# Patient Record
Sex: Female | Born: 1990 | Race: White | Hispanic: No | Marital: Married
Health system: Southern US, Community
[De-identification: ages and names within clinical notes are randomized; demographics above are authoritative.]

## PROBLEM LIST (undated history)

## (undated) DIAGNOSIS — F329 Major depressive disorder, single episode, unspecified: Secondary | ICD-10-CM

## (undated) DIAGNOSIS — F419 Anxiety disorder, unspecified: Secondary | ICD-10-CM

## (undated) DIAGNOSIS — F32A Depression, unspecified: Secondary | ICD-10-CM

## (undated) HISTORY — DX: Depression, unspecified: F32.A

## (undated) HISTORY — DX: Anxiety disorder, unspecified: F41.9

---
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
## 2009-11-26 ENCOUNTER — Ambulatory Visit (HOSPITAL_COMMUNITY): Admission: RE | Admit: 2009-11-26 | Discharge: 2009-11-26 | Payer: Self-pay | Admitting: Obstetrics and Gynecology

## 2010-01-23 ENCOUNTER — Ambulatory Visit (HOSPITAL_COMMUNITY): Admission: RE | Admit: 2010-01-23 | Discharge: 2010-01-23 | Payer: Self-pay | Admitting: Obstetrics and Gynecology

## 2010-03-07 ENCOUNTER — Ambulatory Visit (HOSPITAL_COMMUNITY): Admission: RE | Admit: 2010-03-07 | Discharge: 2010-03-07 | Payer: Self-pay | Admitting: Obstetrics and Gynecology

## 2011-01-18 ENCOUNTER — Encounter: Payer: Self-pay | Admitting: Obstetrics and Gynecology

## 2011-08-21 IMAGING — US US OB FOLLOW-UP
1 series · 14 of 28 positions shown · non-contrast
Comparison: none

OBSTETRICAL ULTRASOUND:
 This ultrasound was performed in The [HOSPITAL], and the [facility] report will be stored to [REDACTED] PACS.  This report is also available in [HOSPITAL]?s accessANYware.

[Series 1: us ob follow-up · 14 of 37 slices shown]
[im 2/37]
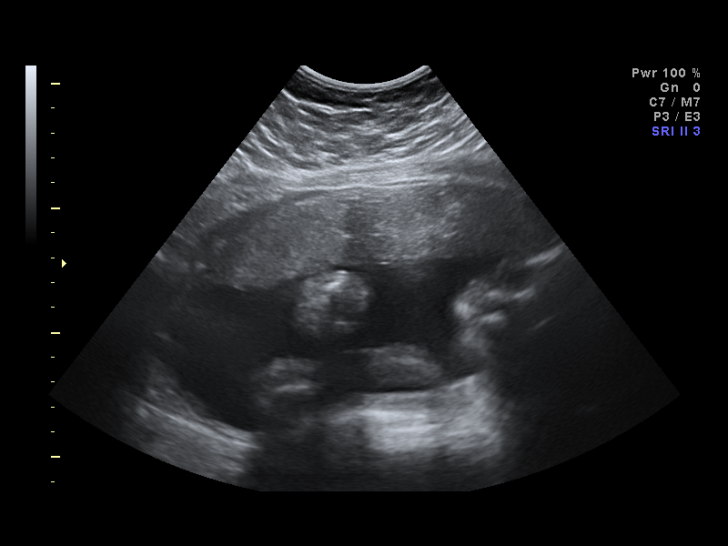
[im 5/37]
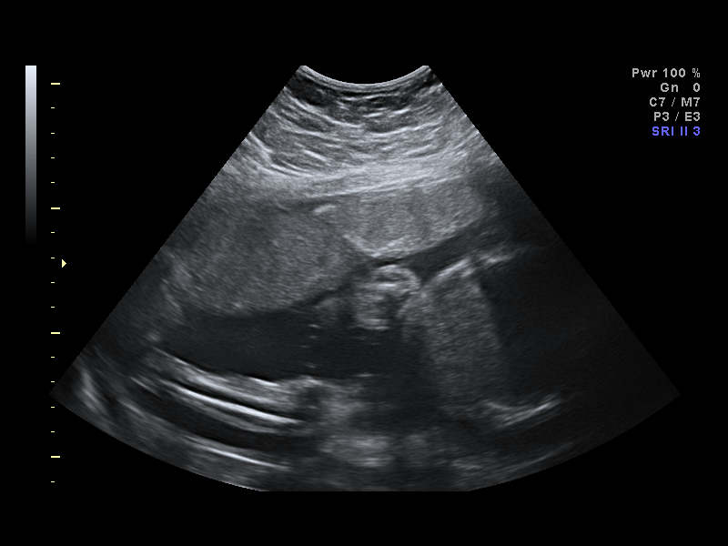
[im 7/37]
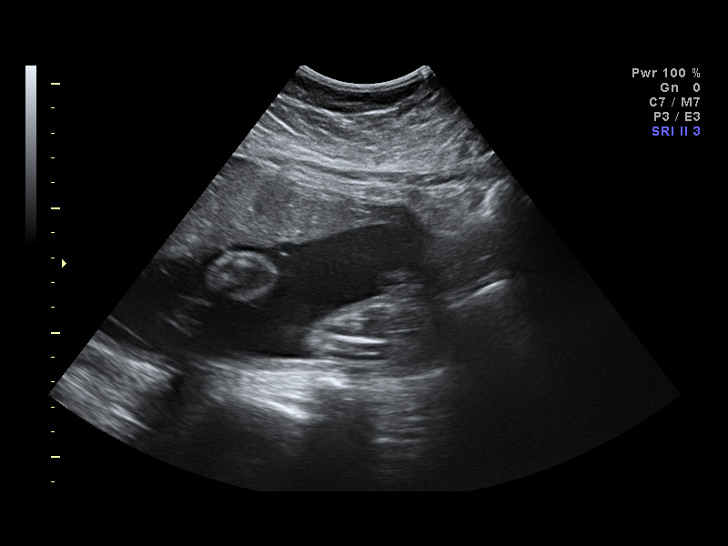
[im 10/37]
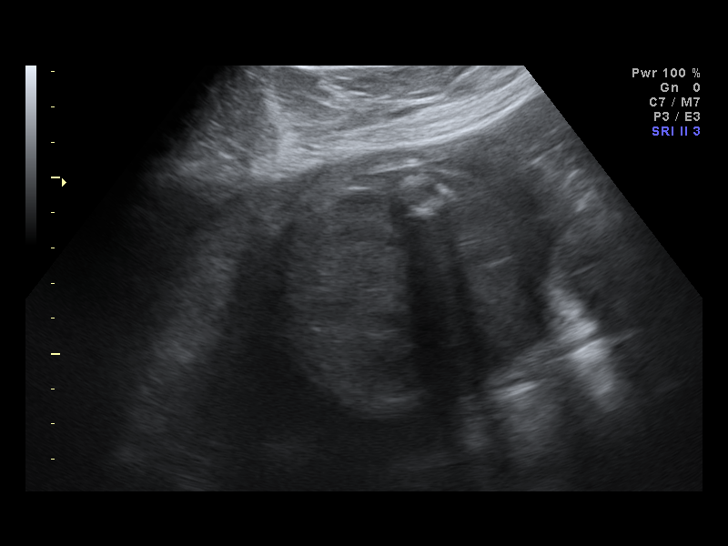
[im 13/37]
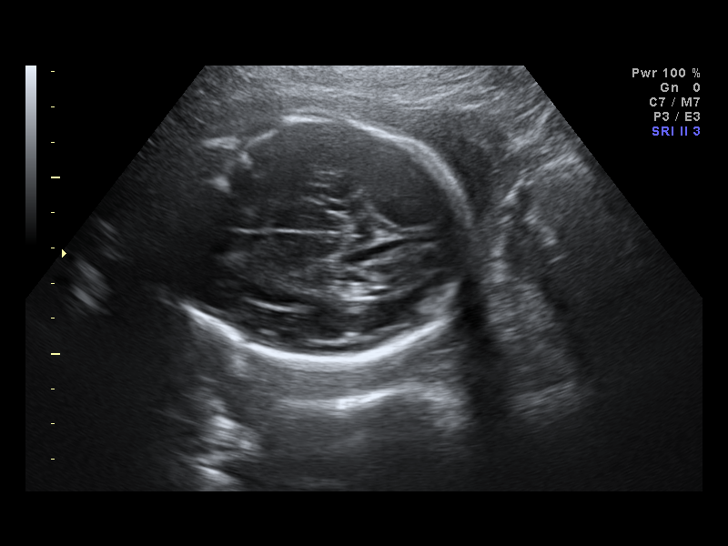
[im 15/37]
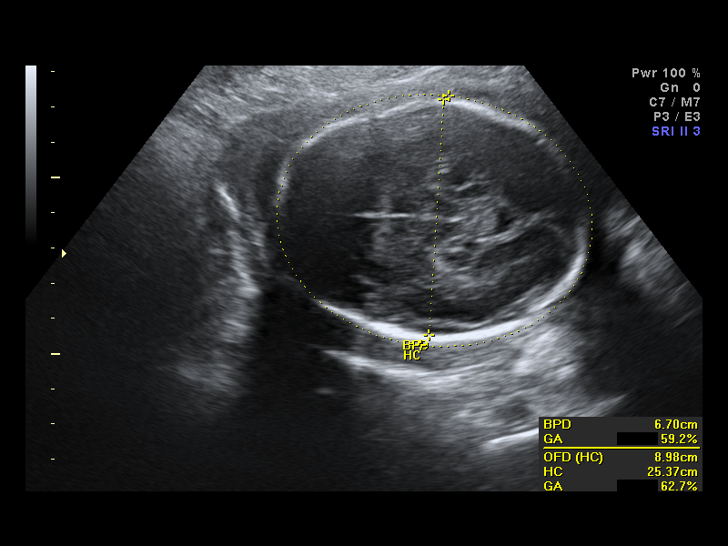
[im 18/37]
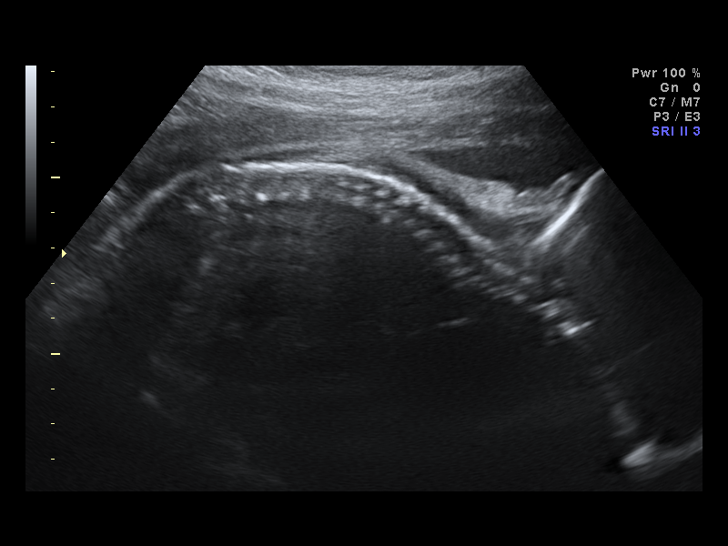
[im 21/37]
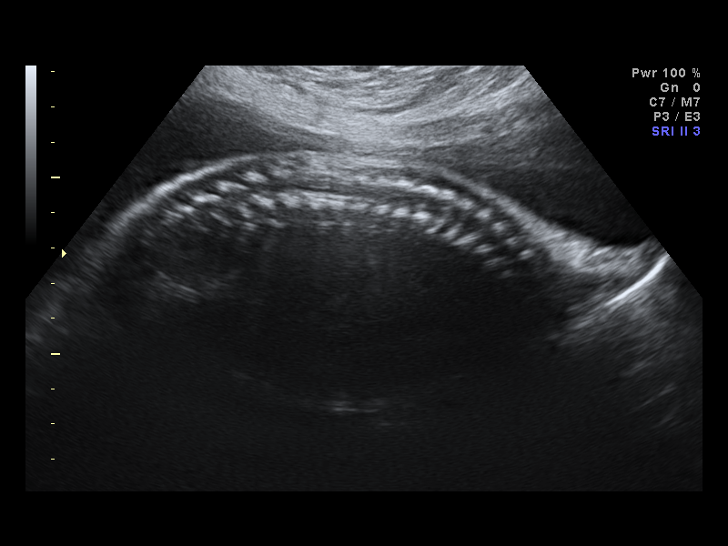
[im 23/37]
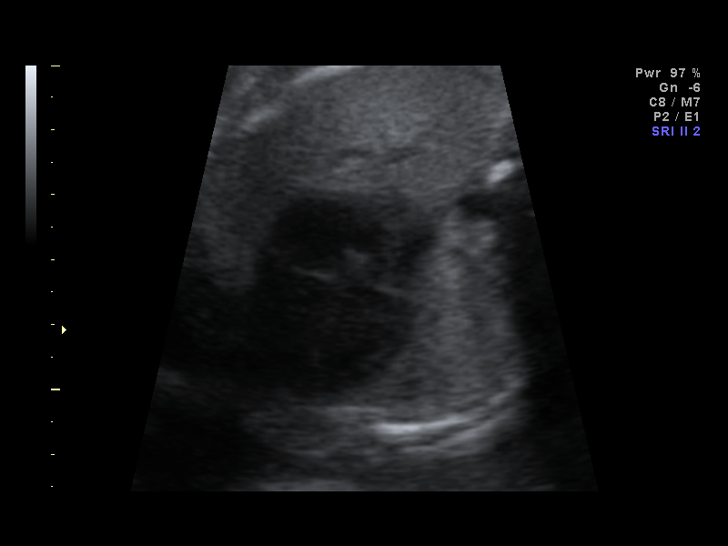
[im 26/37]
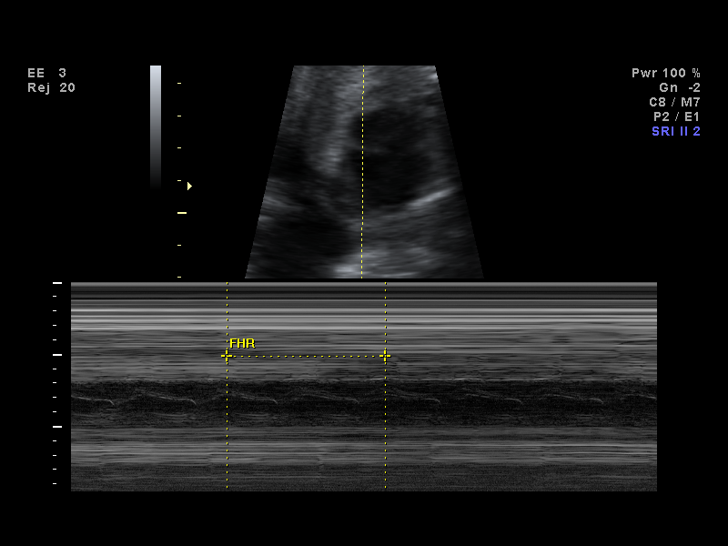
[im 29/37]
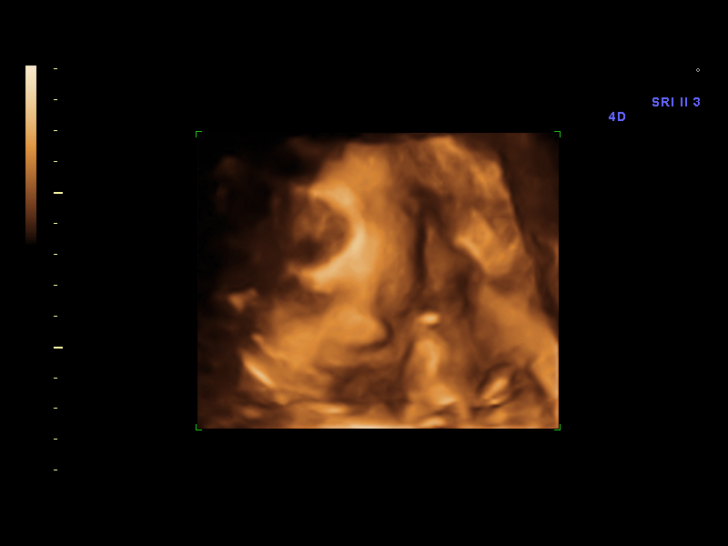
[im 31/37]
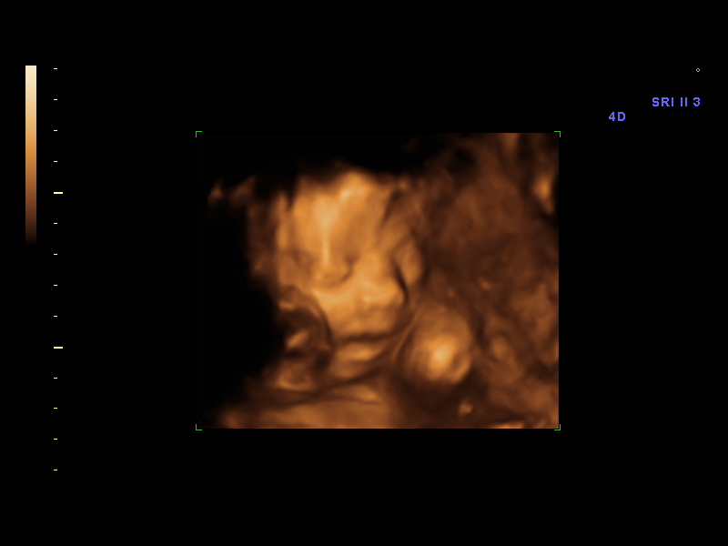
[im 34/37]
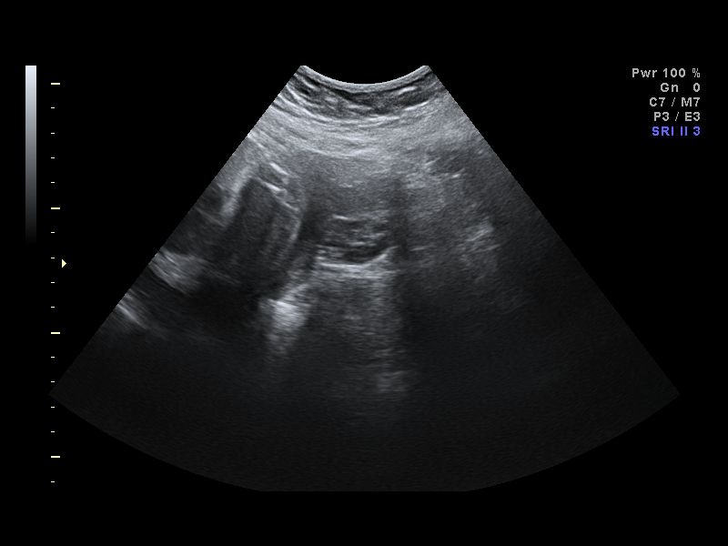
[im 37/37]
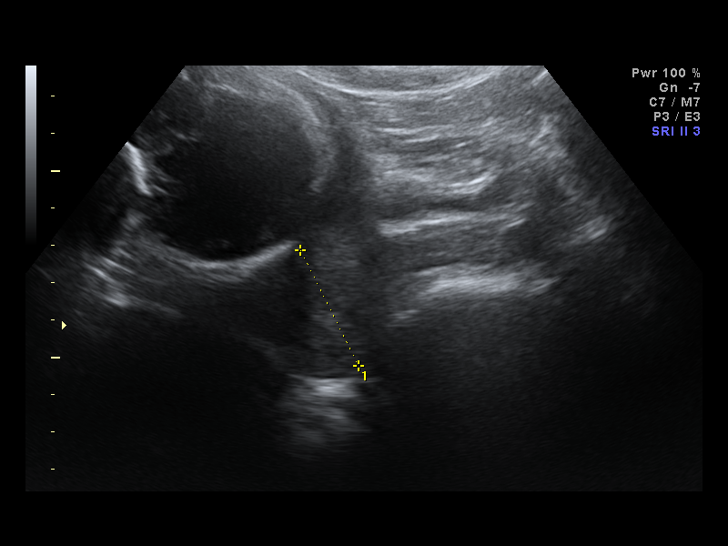

[14 of 28 positions shown; findings below may reference images not displayed]

IMPRESSION: [facility] has also been faxed to the ordering physician.

## 2017-01-22 DIAGNOSIS — F329 Major depressive disorder, single episode, unspecified: Secondary | ICD-10-CM | POA: Insufficient documentation

## 2017-01-22 DIAGNOSIS — F331 Major depressive disorder, recurrent, moderate: Secondary | ICD-10-CM | POA: Diagnosis not present

## 2017-01-22 DIAGNOSIS — R5383 Other fatigue: Secondary | ICD-10-CM | POA: Diagnosis not present

## 2017-01-22 DIAGNOSIS — R3981 Functional urinary incontinence: Secondary | ICD-10-CM | POA: Diagnosis not present

## 2017-01-22 DIAGNOSIS — R1030 Lower abdominal pain, unspecified: Secondary | ICD-10-CM | POA: Diagnosis not present

## 2017-08-30 DIAGNOSIS — N83201 Unspecified ovarian cyst, right side: Secondary | ICD-10-CM | POA: Diagnosis not present

## 2017-08-30 DIAGNOSIS — R109 Unspecified abdominal pain: Secondary | ICD-10-CM | POA: Diagnosis not present

## 2017-08-30 DIAGNOSIS — R1031 Right lower quadrant pain: Secondary | ICD-10-CM | POA: Diagnosis not present

## 2017-08-30 DIAGNOSIS — F17210 Nicotine dependence, cigarettes, uncomplicated: Secondary | ICD-10-CM | POA: Diagnosis not present

## 2017-08-30 DIAGNOSIS — L259 Unspecified contact dermatitis, unspecified cause: Secondary | ICD-10-CM | POA: Diagnosis not present

## 2017-08-30 DIAGNOSIS — R51 Headache: Secondary | ICD-10-CM | POA: Diagnosis not present

## 2017-08-30 DIAGNOSIS — R102 Pelvic and perineal pain: Secondary | ICD-10-CM | POA: Diagnosis not present

## 2017-09-21 DIAGNOSIS — Z3046 Encounter for surveillance of implantable subdermal contraceptive: Secondary | ICD-10-CM | POA: Diagnosis not present

## 2017-12-13 DIAGNOSIS — Z3200 Encounter for pregnancy test, result unknown: Secondary | ICD-10-CM | POA: Diagnosis not present

## 2017-12-15 DIAGNOSIS — L300 Nummular dermatitis: Secondary | ICD-10-CM | POA: Diagnosis not present

## 2017-12-15 DIAGNOSIS — L299 Pruritus, unspecified: Secondary | ICD-10-CM | POA: Diagnosis not present

## 2018-03-11 DIAGNOSIS — R531 Weakness: Secondary | ICD-10-CM | POA: Diagnosis not present

## 2018-03-11 DIAGNOSIS — R5383 Other fatigue: Secondary | ICD-10-CM | POA: Diagnosis not present

## 2018-05-12 DIAGNOSIS — Z6841 Body Mass Index (BMI) 40.0 and over, adult: Secondary | ICD-10-CM | POA: Diagnosis not present

## 2018-05-12 DIAGNOSIS — F411 Generalized anxiety disorder: Secondary | ICD-10-CM | POA: Diagnosis not present

## 2018-05-12 DIAGNOSIS — Z1331 Encounter for screening for depression: Secondary | ICD-10-CM | POA: Diagnosis not present

## 2018-06-10 DIAGNOSIS — B09 Unspecified viral infection characterized by skin and mucous membrane lesions: Secondary | ICD-10-CM | POA: Diagnosis not present

## 2018-06-10 DIAGNOSIS — Z6841 Body Mass Index (BMI) 40.0 and over, adult: Secondary | ICD-10-CM | POA: Diagnosis not present

## 2018-06-12 DIAGNOSIS — R21 Rash and other nonspecific skin eruption: Secondary | ICD-10-CM | POA: Diagnosis not present

## 2018-06-12 DIAGNOSIS — L309 Dermatitis, unspecified: Secondary | ICD-10-CM | POA: Diagnosis not present

## 2018-06-12 DIAGNOSIS — L539 Erythematous condition, unspecified: Secondary | ICD-10-CM | POA: Diagnosis not present

## 2018-06-12 DIAGNOSIS — L299 Pruritus, unspecified: Secondary | ICD-10-CM | POA: Diagnosis not present

## 2018-06-12 DIAGNOSIS — Z6841 Body Mass Index (BMI) 40.0 and over, adult: Secondary | ICD-10-CM | POA: Diagnosis not present

## 2018-07-04 DIAGNOSIS — Z3481 Encounter for supervision of other normal pregnancy, first trimester: Secondary | ICD-10-CM | POA: Diagnosis not present

## 2018-07-04 DIAGNOSIS — Z139 Encounter for screening, unspecified: Secondary | ICD-10-CM | POA: Diagnosis not present

## 2018-07-13 DIAGNOSIS — O039 Complete or unspecified spontaneous abortion without complication: Secondary | ICD-10-CM | POA: Diagnosis not present

## 2018-07-13 DIAGNOSIS — O209 Hemorrhage in early pregnancy, unspecified: Secondary | ICD-10-CM | POA: Diagnosis not present

## 2018-07-13 DIAGNOSIS — Z3A00 Weeks of gestation of pregnancy not specified: Secondary | ICD-10-CM | POA: Diagnosis not present

## 2018-10-11 DIAGNOSIS — J069 Acute upper respiratory infection, unspecified: Secondary | ICD-10-CM | POA: Diagnosis not present

## 2018-10-11 DIAGNOSIS — Z6841 Body Mass Index (BMI) 40.0 and over, adult: Secondary | ICD-10-CM | POA: Diagnosis not present

## 2018-12-07 DIAGNOSIS — Z6841 Body Mass Index (BMI) 40.0 and over, adult: Secondary | ICD-10-CM | POA: Diagnosis not present

## 2018-12-07 DIAGNOSIS — E669 Obesity, unspecified: Secondary | ICD-10-CM | POA: Diagnosis not present

## 2018-12-07 DIAGNOSIS — R5383 Other fatigue: Secondary | ICD-10-CM | POA: Diagnosis not present

## 2018-12-12 DIAGNOSIS — Z3009 Encounter for other general counseling and advice on contraception: Secondary | ICD-10-CM | POA: Diagnosis not present

## 2018-12-12 DIAGNOSIS — Z0000 Encounter for general adult medical examination without abnormal findings: Secondary | ICD-10-CM | POA: Diagnosis not present

## 2018-12-12 DIAGNOSIS — Z309 Encounter for contraceptive management, unspecified: Secondary | ICD-10-CM | POA: Diagnosis not present

## 2019-01-13 DIAGNOSIS — N912 Amenorrhea, unspecified: Secondary | ICD-10-CM | POA: Diagnosis not present

## 2019-01-13 DIAGNOSIS — Z3687 Encounter for antenatal screening for uncertain dates: Secondary | ICD-10-CM | POA: Diagnosis not present

## 2019-01-13 DIAGNOSIS — Z113 Encounter for screening for infections with a predominantly sexual mode of transmission: Secondary | ICD-10-CM | POA: Diagnosis not present

## 2019-01-13 DIAGNOSIS — Z3A01 Less than 8 weeks gestation of pregnancy: Secondary | ICD-10-CM | POA: Diagnosis not present

## 2019-01-24 DIAGNOSIS — Z3687 Encounter for antenatal screening for uncertain dates: Secondary | ICD-10-CM | POA: Diagnosis not present

## 2019-01-24 DIAGNOSIS — Z3A08 8 weeks gestation of pregnancy: Secondary | ICD-10-CM | POA: Diagnosis not present

## 2019-02-27 DIAGNOSIS — Z3682 Encounter for antenatal screening for nuchal translucency: Secondary | ICD-10-CM | POA: Diagnosis not present

## 2019-02-27 DIAGNOSIS — Z3A13 13 weeks gestation of pregnancy: Secondary | ICD-10-CM | POA: Diagnosis not present

## 2019-03-24 DIAGNOSIS — Z3A18 18 weeks gestation of pregnancy: Secondary | ICD-10-CM | POA: Diagnosis not present

## 2019-04-07 DIAGNOSIS — Z36 Encounter for antenatal screening for chromosomal anomalies: Secondary | ICD-10-CM | POA: Diagnosis not present

## 2019-04-07 DIAGNOSIS — Z363 Encounter for antenatal screening for malformations: Secondary | ICD-10-CM | POA: Diagnosis not present

## 2019-04-07 DIAGNOSIS — Z3A19 19 weeks gestation of pregnancy: Secondary | ICD-10-CM | POA: Diagnosis not present

## 2019-04-27 DIAGNOSIS — Z98891 History of uterine scar from previous surgery: Secondary | ICD-10-CM | POA: Insufficient documentation

## 2019-05-24 DIAGNOSIS — Z3A25 25 weeks gestation of pregnancy: Secondary | ICD-10-CM | POA: Diagnosis not present

## 2019-05-24 DIAGNOSIS — O359XX0 Maternal care for (suspected) fetal abnormality and damage, unspecified, not applicable or unspecified: Secondary | ICD-10-CM | POA: Diagnosis not present

## 2019-06-16 DIAGNOSIS — Z3A29 29 weeks gestation of pregnancy: Secondary | ICD-10-CM | POA: Diagnosis not present

## 2019-06-16 DIAGNOSIS — Z131 Encounter for screening for diabetes mellitus: Secondary | ICD-10-CM | POA: Diagnosis not present

## 2019-06-16 DIAGNOSIS — Z3689 Encounter for other specified antenatal screening: Secondary | ICD-10-CM | POA: Diagnosis not present

## 2019-06-22 DIAGNOSIS — R7302 Impaired glucose tolerance (oral): Secondary | ICD-10-CM | POA: Diagnosis not present

## 2019-07-03 DIAGNOSIS — I471 Supraventricular tachycardia: Secondary | ICD-10-CM | POA: Diagnosis not present

## 2019-07-03 DIAGNOSIS — R61 Generalized hyperhidrosis: Secondary | ICD-10-CM | POA: Diagnosis not present

## 2019-07-03 DIAGNOSIS — R079 Chest pain, unspecified: Secondary | ICD-10-CM | POA: Diagnosis not present

## 2019-07-03 DIAGNOSIS — R000 Tachycardia, unspecified: Secondary | ICD-10-CM | POA: Diagnosis not present

## 2019-07-05 DIAGNOSIS — R000 Tachycardia, unspecified: Secondary | ICD-10-CM | POA: Diagnosis not present

## 2019-07-05 DIAGNOSIS — F17210 Nicotine dependence, cigarettes, uncomplicated: Secondary | ICD-10-CM | POA: Diagnosis not present

## 2019-07-05 DIAGNOSIS — I471 Supraventricular tachycardia: Secondary | ICD-10-CM | POA: Diagnosis not present

## 2019-07-06 DIAGNOSIS — I471 Supraventricular tachycardia, unspecified: Secondary | ICD-10-CM

## 2019-07-06 DIAGNOSIS — R002 Palpitations: Secondary | ICD-10-CM

## 2019-07-07 ENCOUNTER — Other Ambulatory Visit: Payer: Self-pay

## 2019-07-07 ENCOUNTER — Telehealth (INDEPENDENT_AMBULATORY_CARE_PROVIDER_SITE_OTHER): Payer: BC Managed Care – PPO | Admitting: Cardiology

## 2019-07-07 ENCOUNTER — Encounter: Payer: Self-pay | Admitting: Cardiology

## 2019-07-07 VITALS — BP 114/58 | Ht 66.0 in | Wt 280.0 lb

## 2019-07-07 DIAGNOSIS — Z3A32 32 weeks gestation of pregnancy: Secondary | ICD-10-CM

## 2019-07-07 DIAGNOSIS — Z3490 Encounter for supervision of normal pregnancy, unspecified, unspecified trimester: Secondary | ICD-10-CM | POA: Insufficient documentation

## 2019-07-07 DIAGNOSIS — I471 Supraventricular tachycardia: Secondary | ICD-10-CM

## 2019-07-07 DIAGNOSIS — R002 Palpitations: Secondary | ICD-10-CM

## 2019-07-07 NOTE — Addendum Note (Signed)
Addended by: Polly Cobia A on: 07/07/2019 01:53 PM   Modules accepted: Orders

## 2019-07-07 NOTE — Progress Notes (Signed)
Virtual Visit via Video Note   This visit type was conducted due to national recommendations for restrictions regarding the COVID-19 Pandemic (e.g. social distancing) in an effort to limit this patient's exposure and mitigate transmission in our community.  Due to her co-morbid illnesses, this patient is at least at moderate risk for complications without adequate follow up.  This format is felt to be most appropriate for this patient at this time.  All issues noted in this document were discussed and addressed.  A limited physical exam was performed with this format.  Please refer to the patient's chart for her consent to telehealth for [facility].  Evaluation Performed:  Follow-up visit  This visit type was conducted due to national recommendations for restrictions regarding the COVID-19 Pandemic (e.g. social distancing).  This format is felt to be most appropriate for this patient at this time.  All issues noted in this document were discussed and addressed.  No physical exam was performed (except for noted visual exam findings with Video Visits).  Please refer to the patient's chart (MyChart message for video visits and phone note for telephone visits) for the patient's consent to telehealth for [facility].  Date:  07/07/2019  ID: Eston Mould, DOB 1991-06-30, MRN [id number]   Patient Location: [address] [place] [address]   Provider location:   [place] Office  PCP:  Patient, No Pcp Per  Cardiologist:  Jenne Campus, MD     Chief Complaint: I have palpitations  History of Present Illness:    Jill Stewart is a 28 y.o. female  who presents via audio/video conferencing for a telehealth visit today.  Healthy 29 years old woman with a who is in her fourth pregnancy.  32nd week.  Few days ago she was sitting at the table eating and then she started feeling palpitations that was associated with some shortness of breath as well as neck tightness.  She  called 911 eventually.  Valsalva maneuver were used and she broke to normal rhythm.  When she came to hospital a lot of tests were done in hospital all were normal.  She was referred to [place] for evaluation overall she is doing well this is first episode ever.  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  Denies having any chest pain tightness squeezing pressure burning chest overall she seems to be doing well.   The patient does not have symptoms concerning for COVID-19 infection (fever, chills, cough, or new SHORTNESS OF BREATH).    Prior CV studies:   The following studies were reviewed today:  EKG done in the hospital showed normal sinus rhythm normal P interval incomplete right bundle branch block.     Past Medical History:  Diagnosis Date  . Anxiety   . Depression        No outpatient medications have been marked as taking for the 07/07/19 encounter (Telemedicine) with Park Liter, MD.      Family History: The patient's family history includes Diabetes in her mother.   ROS:   Please see the history of present illness.     All other systems reviewed and are negative.   Labs/Other Tests and Data Reviewed:     Recent Labs: No results found for requested labs within last 8760 hours.  Recent Lipid Panel No results found for: CHOL,  TRIG, HDL, CHOLHDL, VLDL, LDLCALC, LDLDIRECT    Exam:    Vital Signs:  BP (!) 114/58   Ht 5\' 6"  (1.676 m)   Wt 280 lb (127 kg)   BMI 45.19 kg/m     Wt Readings from Last 3 Encounters:  07/07/19 280 lb (127 kg)     Well nourished, well developed in no acute distress. Alert awake and x3 and talking to her over the video link.  She is sitting in the living room at her home.  [place] [facility].  Diagnosis for this visit:   1. SVT (supraventricular tachycardia) (HCC)   2. Palpitations   3. [redacted] weeks gestation of  pregnancy      ASSESSMENT & PLAN:    1.  SVT.  I think we can simply watch the situation.  I explained again 1 more time how to do Valsalva maneuvers.  I do not see any indication for medication now however if those episodes of SVT became more frequent and medication can be initiated.  Her pregnancy is already advanced.  Digoxin may be a good choice.  I talked to her also about what supraventricular tachycardia is how it happens what can be done to eliminate this completely which is ablation.  Again since this is first episodes ever we simply watch the situation.  She does have incomplete right bundle branch block on the EKG therefore I will ask her to have an echocardiogram done to make sure she does not have ASD. 2.  Palpitations: Supraventricular tachycardia plan as outlined above 3.  Pregnancy.  Overall doing well this is her fourth pregnancy.  She lost 1 child during the first pregnancy she had some high blood pressure otherwise uncomplicated pregnancies.  COVID-19 Education: The signs and symptoms of COVID-19 were discussed with the patient and how to seek care for testing (follow up with PCP or arrange E-visit).  The importance of social distancing was discussed today.  Patient Risk:   After full review of this patients clinical status, I feel that they are at least moderate risk at this time.  Time:   Today, I have spent 21 minutes with the patient with telehealth technology discussing pt health issues.  I spent 5 minutes reviewing her chart before the visit.  Visit was finished at 1:26 PM.    Medication Adjustments/Labs and Tests Ordered: Current medicines are reviewed at length with the patient today.  Concerns regarding medicines are outlined above.  No orders of the defined types were placed in this encounter.  Medication changes: No orders of the defined types were placed in this encounter.    Disposition: Follow-up in 3 months  Signed, Georgeanna Leaobert J. , MD, [facility]  07/07/2019 1:25 PM    [facility]

## 2019-07-07 NOTE — Patient Instructions (Addendum)
Medication Instructions:   None If you need a refill on your cardiac medications before your next appointment, please call your pharmacy.   Lab work: None  If you have labs (blood work) drawn today and your tests are completely normal, you will receive your results only by: Marland Kitchen MyChart Message (if you have MyChart) OR . A paper copy in the mail If you have any lab test that is abnormal or we need to change your treatment, we will call you to review the results.  Testing/Procedures: Your physician has requested that you have an echocardiogram. Echocardiography is a painless test that uses sound waves to create images of your heart. It provides your doctor with information about the size and shape of your heart and how well your heart's chambers and valves are working. This procedure takes approximately one hour. There are no restrictions for this procedure.  Echocardiogram scheduled for 08/24/19 at 0915 at [facility] office.   Follow-Up: At [facility], you and your health needs are our priority.  As part of our continuing mission to provide you with exceptional heart care, we have created designated Provider Care Teams.  These Care Teams include your primary Cardiologist (physician) and Advanced Practice Providers (APPs -  Physician Assistants and Nurse Practitioners) who all work together to provide you with the care you need, when you need it. . You will need a follow up appointment in 3 months. F/U visit scheduled 10/10/19 at 4 pm in [place] office.   Any Other Special Instructions Will Be Listed Below (If Applicable). None

## 2019-08-03 DIAGNOSIS — Z3A36 36 weeks gestation of pregnancy: Secondary | ICD-10-CM | POA: Diagnosis not present

## 2019-08-16 DIAGNOSIS — F17210 Nicotine dependence, cigarettes, uncomplicated: Secondary | ICD-10-CM | POA: Diagnosis not present

## 2019-08-16 DIAGNOSIS — O34211 Maternal care for low transverse scar from previous cesarean delivery: Secondary | ICD-10-CM | POA: Diagnosis not present

## 2019-08-16 DIAGNOSIS — N858 Other specified noninflammatory disorders of uterus: Secondary | ICD-10-CM | POA: Diagnosis not present

## 2019-08-16 DIAGNOSIS — O99334 Smoking (tobacco) complicating childbirth: Secondary | ICD-10-CM | POA: Diagnosis not present

## 2019-08-16 DIAGNOSIS — Z3A37 37 weeks gestation of pregnancy: Secondary | ICD-10-CM | POA: Diagnosis not present

## 2019-08-24 ENCOUNTER — Other Ambulatory Visit: Payer: Medicaid Other

## 2019-09-13 ENCOUNTER — Other Ambulatory Visit: Payer: Medicaid Other

## 2019-09-20 DIAGNOSIS — Z20828 Contact with and (suspected) exposure to other viral communicable diseases: Secondary | ICD-10-CM | POA: Diagnosis not present

## 2019-10-10 ENCOUNTER — Ambulatory Visit: Payer: Medicaid Other | Admitting: Cardiology

## 2019-10-24 ENCOUNTER — Ambulatory Visit: Payer: Medicaid Other | Admitting: Cardiology

## 2019-10-30 ENCOUNTER — Ambulatory Visit: Payer: Self-pay | Admitting: Cardiology

## 2019-11-20 DIAGNOSIS — B354 Tinea corporis: Secondary | ICD-10-CM | POA: Diagnosis not present

## 2019-11-29 ENCOUNTER — Other Ambulatory Visit: Payer: Self-pay

## 2020-01-01 DIAGNOSIS — Z6841 Body Mass Index (BMI) 40.0 and over, adult: Secondary | ICD-10-CM | POA: Diagnosis not present

## 2020-01-01 DIAGNOSIS — E038 Other specified hypothyroidism: Secondary | ICD-10-CM | POA: Diagnosis not present

## 2020-01-01 DIAGNOSIS — R5383 Other fatigue: Secondary | ICD-10-CM | POA: Diagnosis not present

## 2020-01-01 DIAGNOSIS — E7800 Pure hypercholesterolemia, unspecified: Secondary | ICD-10-CM | POA: Diagnosis not present

## 2020-01-01 DIAGNOSIS — Z139 Encounter for screening, unspecified: Secondary | ICD-10-CM | POA: Diagnosis not present

## 2020-01-01 DIAGNOSIS — E039 Hypothyroidism, unspecified: Secondary | ICD-10-CM | POA: Diagnosis not present

## 2020-01-01 DIAGNOSIS — E119 Type 2 diabetes mellitus without complications: Secondary | ICD-10-CM | POA: Diagnosis not present

## 2020-10-28 DIAGNOSIS — Z131 Encounter for screening for diabetes mellitus: Secondary | ICD-10-CM | POA: Insufficient documentation

## 2020-10-28 DIAGNOSIS — Z124 Encounter for screening for malignant neoplasm of cervix: Secondary | ICD-10-CM | POA: Insufficient documentation

## 2021-09-05 ENCOUNTER — Encounter: Payer: Self-pay | Admitting: Cardiology

## 2021-09-05 ENCOUNTER — Other Ambulatory Visit: Payer: Self-pay

## 2021-09-05 ENCOUNTER — Ambulatory Visit (INDEPENDENT_AMBULATORY_CARE_PROVIDER_SITE_OTHER): Payer: Self-pay | Admitting: Cardiology

## 2021-09-05 VITALS — BP 128/74 | HR 88 | Ht 66.0 in | Wt 274.2 lb

## 2021-09-05 DIAGNOSIS — I471 Supraventricular tachycardia: Secondary | ICD-10-CM

## 2021-09-05 DIAGNOSIS — F17200 Nicotine dependence, unspecified, uncomplicated: Secondary | ICD-10-CM | POA: Insufficient documentation

## 2021-09-05 DIAGNOSIS — E785 Hyperlipidemia, unspecified: Secondary | ICD-10-CM

## 2021-09-05 DIAGNOSIS — R002 Palpitations: Secondary | ICD-10-CM

## 2021-09-05 MED ORDER — METOPROLOL SUCCINATE ER 25 MG PO TB24: 25.0000 mg | ORAL_TABLET | Freq: Every day | ORAL | 3 refills | Status: AC

## 2021-09-05 NOTE — Progress Notes (Signed)
Cardiology Office Note:    Date:  09/05/2021   ID:  Jill Stewart, DOB 01/22/1991, MRN [id number]  PCP:  Olive Bass, MD  Cardiologist:  Gypsy Balsam, MD    Referring MD: No ref. provider found   Chief Complaint  Patient presents with   Follow-up  Heart palpitations  History of Present Illness:    Jill Stewart is a 30 y.o. female with past medical history significant for supraventricular tachycardia last time I seen her was in 2020 she was pregnant it was after visit in the emergency room when she went to her because of palpitations.  She was found to be in supraventricular tachycardia she broke with 1 Valsalva maneuver.  Since that time she did not follow-up with me.  She comes today to discuss her issues she described to have palpitations lasting for about 10 to 15 seconds every 6 months or so.  She said when she have it she will feel little bit dizzy but not to the point of passing out that she never passed out, she will get somewhat short of breath.  Otherwise she is doing well she is taking care of her 3 children 71 years old 20 years old and 69 years old and she is very busy.  Denies having any chest pain tightness squeezing pressure in the chest otherwise.  Sad news is the fact that she smokes.  Past Medical History:  Diagnosis Date   Anxiety    Depression     History reviewed. No pertinent surgical history.  Current Medications: Current Meds  Medication Sig   metoprolol succinate (TOPROL XL) 25 MG 24 hr tablet Take 1 tablet (25 mg total) by mouth daily.     Allergies:   Lexapro [escitalopram oxalate] and Sertraline   Social History   Socioeconomic History   Marital status: Married    Spouse name: Not on file   Number of children: Not on file   Years of education: Not on file   Highest education level: Not on file  Occupational History   Not on file  Tobacco Use   Smoking status: Every Day    Types: Cigarettes   Smokeless tobacco: Never   Substance and Sexual Activity   Alcohol use: Not Currently   Drug use: Never   Sexual activity: Not on file  Other Topics Concern   Not on file  Social History Narrative   Not on file   Social Determinants of Health   Financial Resource Strain: Not on file  Food Insecurity: Not on file  Transportation Needs: Not on file  Physical Activity: Not on file  Stress: Not on file  Social Connections: Not on file     Family History: The patient's family history includes Diabetes in her mother. ROS:   Please see the history of present illness.    All 14 point review of systems negative except as described per history of present illness  EKGs/Labs/Other Studies Reviewed:      Recent Labs: No results found for requested labs within last 8760 hours.  Recent Lipid Panel No results found for: CHOL, TRIG, HDL, CHOLHDL, VLDL, LDLCALC, LDLDIRECT  Physical Exam:    VS:  BP 128/74 (BP Location: Right Arm, Patient Position: Sitting)   Pulse 88   Ht 5\' 6"  (1.676 m)   Wt 274 lb 3.2 oz (124.4 kg)   SpO2 98%   BMI 44.26 kg/m     Wt Readings from Last 3 Encounters:  09/05/21 274  lb 3.2 oz (124.4 kg)  07/07/19 280 lb (127 kg)     GEN:  Well nourished, well developed in no acute distress HEENT: Normal NECK: No JVD; No carotid bruits LYMPHATICS: No lymphadenopathy CARDIAC: RRR, no murmurs, no rubs, no gallops RESPIRATORY:  Clear to auscultation without rales, wheezing or rhonchi  ABDOMEN: Soft, non-tender, non-distended MUSCULOSKELETAL:  No edema; No deformity  SKIN: Warm and dry LOWER EXTREMITIES: no swelling NEUROLOGIC:  Alert and oriented x 3 PSYCHIATRIC:  Normal affect   ASSESSMENT:    1. SVT (supraventricular tachycardia) (HCC)   2. Smoking   3. Dyslipidemia   4. Palpitations    PLAN:    In order of problems listed above:  Supraventricular tachycardia.  Plan will be to initiate metoprolol succinate 25 mg daily I told her to take it on the regular basis however I  also told her if she decided to take it only on as-needed basis that will be fine with me.  We revisited again issue of potential ablation however she does not want to do it but since this is an invasive procedure Smoking: We spent at least 10 minutes talking about assessed on recommend to quit she understands she will try to do it Dyslipidemia: I did review her K PN which show me her LDL of 143 HDL 38.  I strongly recommended diet with I discussed basic of Mediterranean diet with.  I encouraged her to be active and exercise and lose some weight.   Medication Adjustments/Labs and Tests Ordered: Current medicines are reviewed at length with the patient today.  Concerns regarding medicines are outlined above.  Orders Placed This Encounter  Procedures   EKG 12-Lead   Medication changes:  Meds ordered this encounter  Medications   metoprolol succinate (TOPROL XL) 25 MG 24 hr tablet    Sig: Take 1 tablet (25 mg total) by mouth daily.    Dispense:  90 tablet    Refill:  3    Signed, Georgeanna Lea, MD, [facility] 09/05/2021 1:29 PM    [facility]

## 2021-09-05 NOTE — Patient Instructions (Signed)
Medication Instructions:  Your physician has recommended you make the following change in your medication:   Start Metoprolol Succinate (Toprol XL) 25 mg daily.   *If you need a refill on your cardiac medications before your next appointment, please call your pharmacy*   Lab Work: None ordered If you have labs (blood work) drawn today and your tests are completely normal, you will receive your results only by: MyChart Message (if you have MyChart) OR A paper copy in the mail If you have any lab test that is abnormal or we need to change your treatment, we will call you to review the results.   Testing/Procedures: None ordered   Follow-Up: At [facility], you and your health needs are our priority.  As part of our continuing mission to provide you with exceptional heart care, we have created designated Provider Care Teams.  These Care Teams include your primary Cardiologist (physician) and Advanced Practice Providers (APPs -  Physician Assistants and Nurse Practitioners) who all work together to provide you with the care you need, when you need it.  We recommend signing up for the patient portal called "MyChart".  Sign up information is provided on this After Visit Summary.  MyChart is used to connect with patients for Virtual Visits (Telemedicine).  Patients are able to view lab/test results, encounter notes, upcoming appointments, etc.  Non-urgent messages can be sent to your provider as well.   To learn more about what you can do with MyChart, go to [email or web address].    Your next appointment:   6 month(s)  The format for your next appointment:   In Person  Provider:   Gypsy Balsam, MD   Other Instructions NA

## 2022-03-10 DIAGNOSIS — F331 Major depressive disorder, recurrent, moderate: Secondary | ICD-10-CM | POA: Insufficient documentation

## 2022-03-10 DIAGNOSIS — Z6841 Body Mass Index (BMI) 40.0 and over, adult: Secondary | ICD-10-CM | POA: Insufficient documentation

## 2022-03-10 DIAGNOSIS — B356 Tinea cruris: Secondary | ICD-10-CM | POA: Insufficient documentation

## 2022-10-29 ENCOUNTER — Ambulatory Visit: Payer: Medicaid Other | Attending: Cardiology | Admitting: Cardiology

## 2022-11-04 ENCOUNTER — Encounter: Payer: Self-pay | Admitting: Cardiology

## 2024-03-21 ENCOUNTER — Ambulatory Visit: Payer: Self-pay | Admitting: Cardiology

## 2024-07-14 ENCOUNTER — Encounter: Payer: Self-pay | Admitting: Advanced Practice Midwife
